# Patient Record
Sex: Female | Born: 2000 | Race: White | Hispanic: No | Marital: Single | Smoking: Never smoker
Health system: Southern US, Community
[De-identification: ages and names within clinical notes are randomized; demographics above are authoritative.]

## PROBLEM LIST (undated history)

## (undated) DIAGNOSIS — J45909 Unspecified asthma, uncomplicated: Secondary | ICD-10-CM

## (undated) DIAGNOSIS — L040 Acute lymphadenitis of face, head and neck: Secondary | ICD-10-CM

## (undated) HISTORY — PX: MYRINGOTOMY WITH TUBE PLACEMENT: SHX5663

## (undated) HISTORY — PX: TONSILLECTOMY: SUR1361

---
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
## 2019-02-03 ENCOUNTER — Ambulatory Visit: Payer: BC Managed Care – PPO | Attending: Internal Medicine

## 2019-02-03 ENCOUNTER — Other Ambulatory Visit: Payer: Self-pay

## 2019-02-03 DIAGNOSIS — Z20822 Contact with and (suspected) exposure to covid-19: Secondary | ICD-10-CM

## 2019-02-04 LAB — NOVEL CORONAVIRUS, NAA: SARS-CoV-2, NAA: NOT DETECTED

## 2019-02-12 ENCOUNTER — Other Ambulatory Visit: Payer: BC Managed Care – PPO

## 2019-03-03 ENCOUNTER — Ambulatory Visit: Payer: BC Managed Care – PPO | Attending: Internal Medicine

## 2019-03-03 ENCOUNTER — Other Ambulatory Visit: Payer: Self-pay

## 2019-03-03 DIAGNOSIS — Z20822 Contact with and (suspected) exposure to covid-19: Secondary | ICD-10-CM

## 2019-03-04 LAB — NOVEL CORONAVIRUS, NAA: SARS-CoV-2, NAA: NOT DETECTED
